# Patient Record
Sex: Female | Born: 2000 | Race: Black or African American | Hispanic: No | Marital: Single | Smoking: Never smoker
Health system: Southern US, Community
[De-identification: ages and names within clinical notes are randomized; demographics above are authoritative.]

## PROBLEM LIST (undated history)

## (undated) DIAGNOSIS — R7303 Prediabetes: Secondary | ICD-10-CM

## (undated) DIAGNOSIS — E119 Type 2 diabetes mellitus without complications: Secondary | ICD-10-CM

## (undated) HISTORY — DX: Prediabetes: R73.03

## (undated) HISTORY — DX: Type 2 diabetes mellitus without complications: E11.9

---
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
## 2021-01-15 ENCOUNTER — Ambulatory Visit (LOCAL_COMMUNITY_HEALTH_CENTER): Payer: Medicaid Other

## 2021-01-15 ENCOUNTER — Other Ambulatory Visit: Payer: Self-pay

## 2021-01-15 VITALS — BP 99/63 | Ht 64.5 in | Wt 137.0 lb

## 2021-01-15 DIAGNOSIS — Z3042 Encounter for surveillance of injectable contraceptive: Secondary | ICD-10-CM | POA: Diagnosis not present

## 2021-01-15 DIAGNOSIS — Z3009 Encounter for other general counseling and advice on contraception: Secondary | ICD-10-CM

## 2021-01-15 NOTE — Progress Notes (Signed)
11 weeks post depo. Voices no concerns. Depo administered today per order by E. Sciora,CNM dated 10/08/2020. Tolerated well L deltoid. Depo consent signed today. Next depo due 04/02/2021, pt has reminder. Jerel Shepherd, RN

## 2021-02-16 DIAGNOSIS — S01331A Puncture wound without foreign body of right ear, initial encounter: Secondary | ICD-10-CM | POA: Diagnosis not present

## 2021-02-16 DIAGNOSIS — S01332A Puncture wound without foreign body of left ear, initial encounter: Secondary | ICD-10-CM | POA: Diagnosis not present

## 2021-02-16 IMAGING — CT CT ABD-PELV W/ CM
2 of 4 series · 15 of 46 positions shown, 17 images · IV contrast (APPLIED)
Comparison: None.

CLINICAL DATA: Perirectal pain.  Abscess on the right.

EXAM:
CT ABDOMEN AND PELVIS WITH CONTRAST
TECHNIQUE: Multidetector CT imaging of the abdomen and pelvis was performed
using the standard protocol following bolus administration of
intravenous contrast.
CONTRAST:  75 mL OMNIPAQUE IOHEXOL 300 MG/ML  SOLN

[Series 2: routine abd/pel with · axial · 0.60mm/px · z∈[-1088,-693]mm · 12 of 95 slices shown, 14 images]
[im 8/95  soft-tissue]
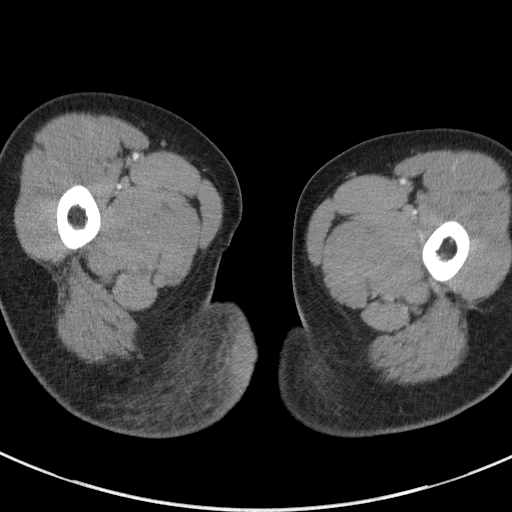
[im 8/95  bone]
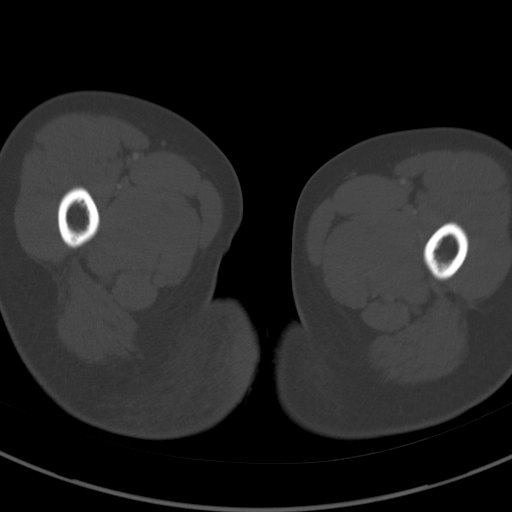
[im 15/95  soft-tissue]
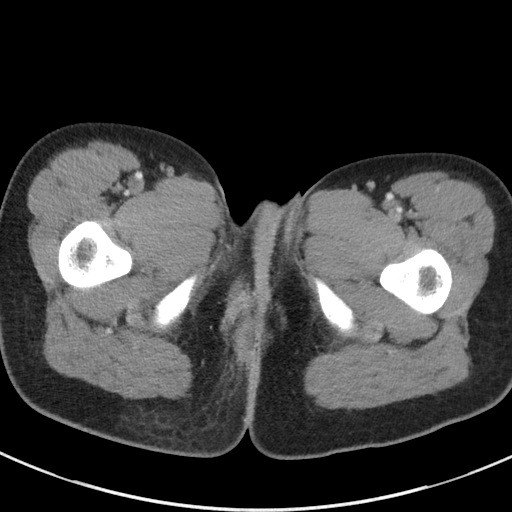
[im 22/95  soft-tissue]
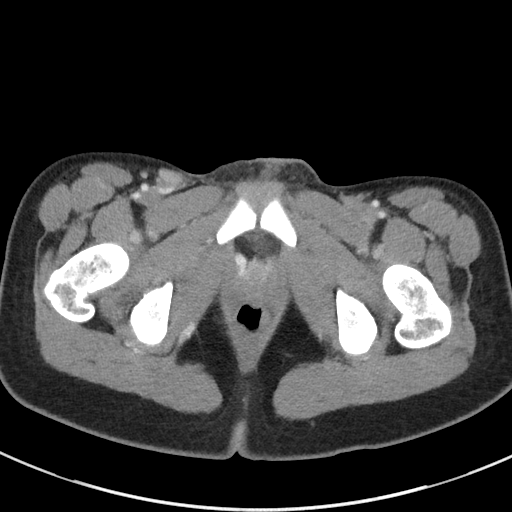
[im 29/95  soft-tissue]
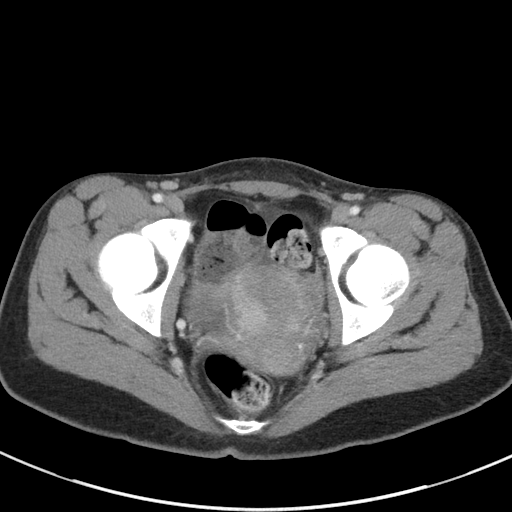
[im 37/95  soft-tissue]
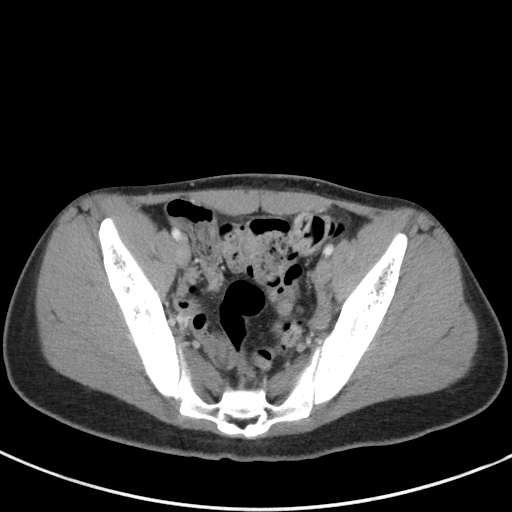
[im 44/95  soft-tissue]
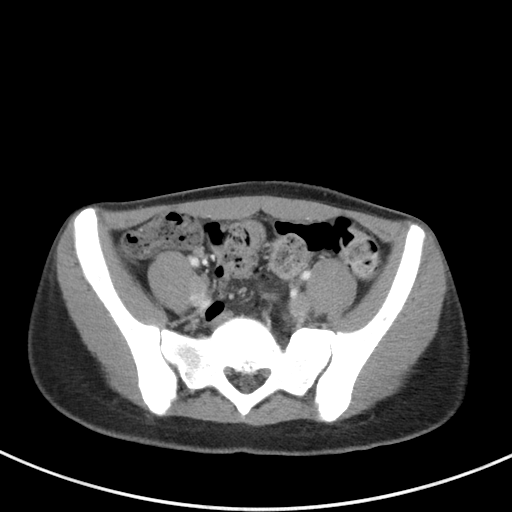
[im 51/95  soft-tissue]
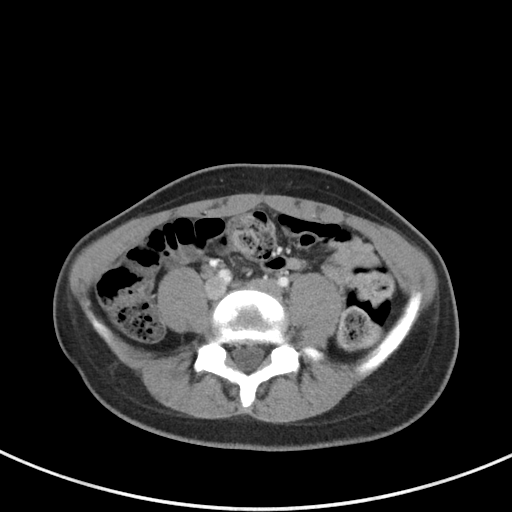
[im 58/95  soft-tissue]
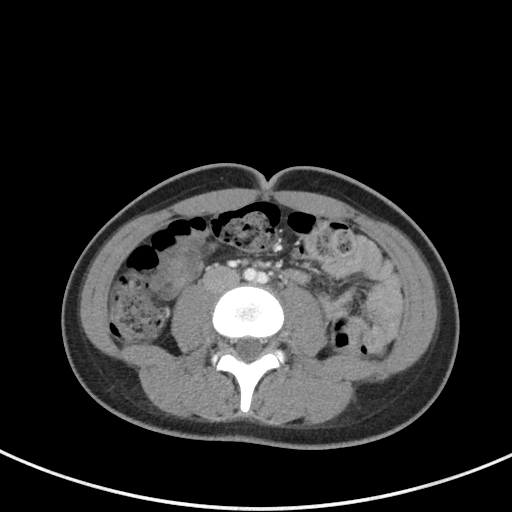
[im 66/95  soft-tissue]
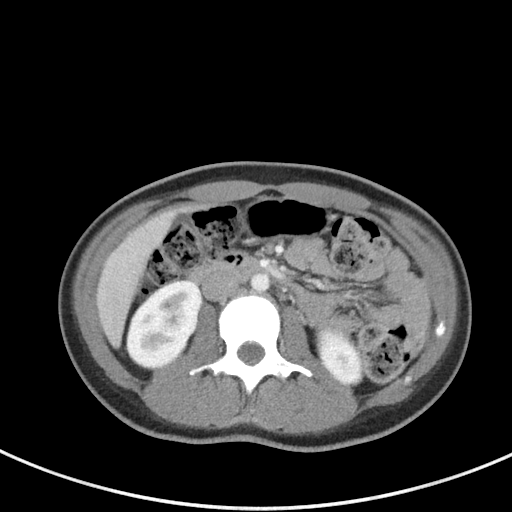
[im 66/95  bone]
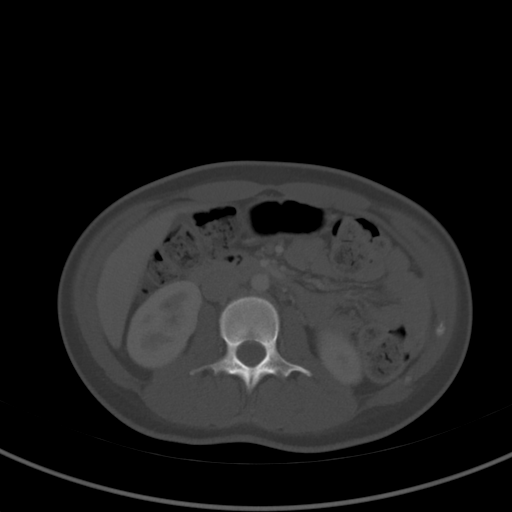
[im 73/95  soft-tissue]
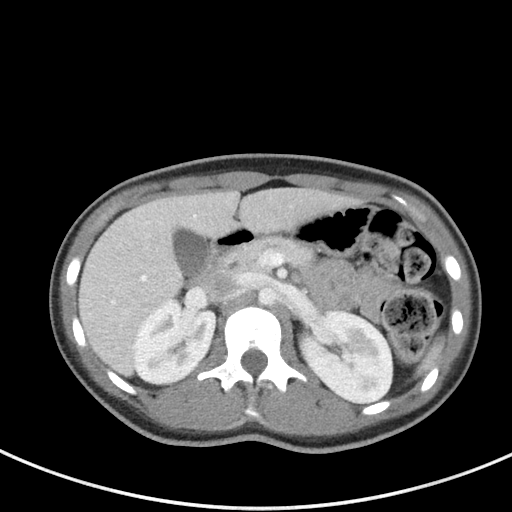
[im 80/95  soft-tissue]
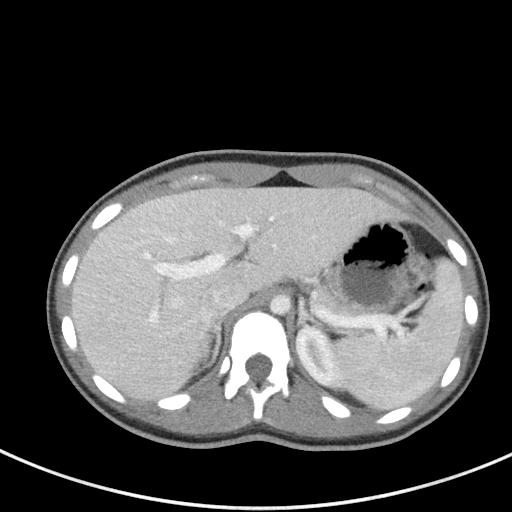
[im 87/95  soft-tissue]
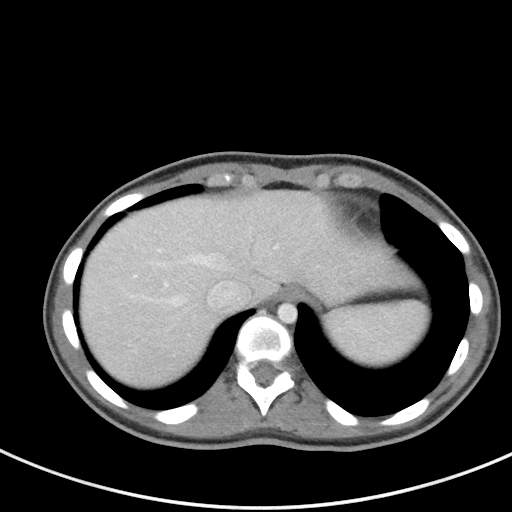

[Series 5: coronal st · coronal · 0.67mm/px · 3 of 62 slices shown]
[im 21/62  soft-tissue]
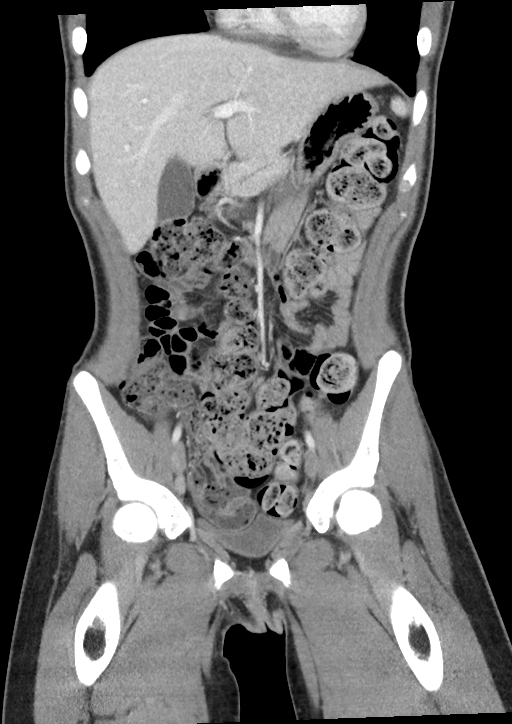
[im 28/62  soft-tissue]
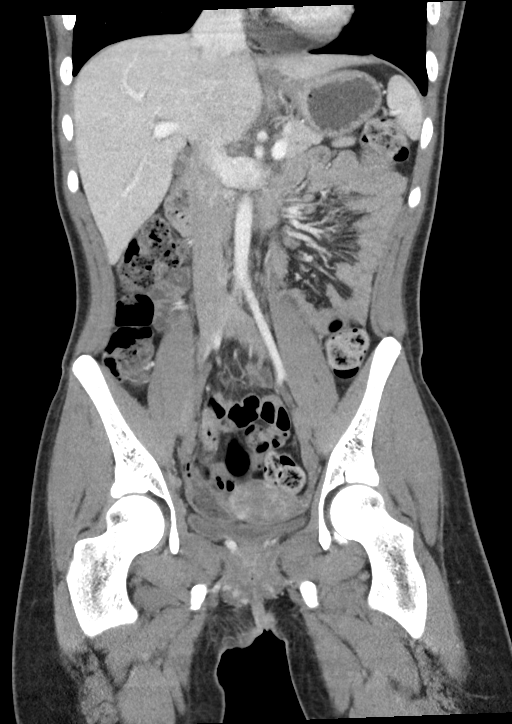
[im 34/62  soft-tissue]
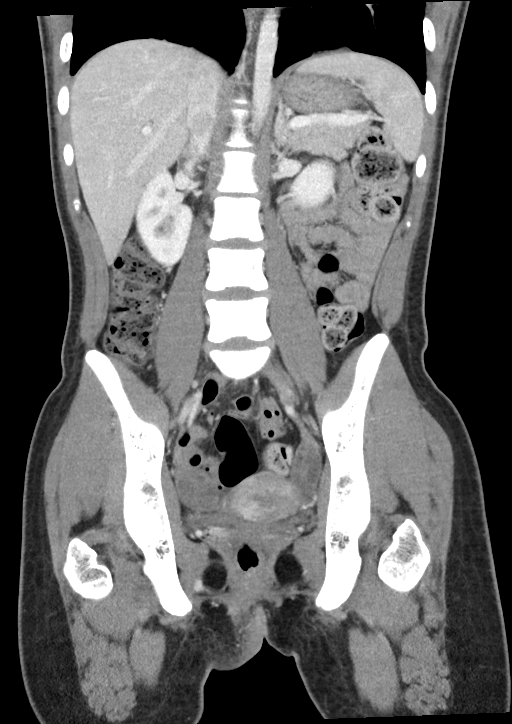

[15 of 46 positions shown; findings below may reference images not displayed]

FINDINGS: Lower chest: Lung bases clear.  No pleural or pericardial effusion.

Hepatobiliary: No focal liver abnormality is seen. No gallstones,
gallbladder wall thickening, or biliary dilatation.

Pancreas: Unremarkable. No pancreatic ductal dilatation or
surrounding inflammatory changes.

Spleen: Normal in size without focal abnormality.

Adrenals/Urinary Tract: Adrenal glands are unremarkable. Kidneys are
normal, without renal calculi, focal lesion, or hydronephrosis.
Bladder is unremarkable.

Stomach/Bowel: Stomach is within normal limits. Appendix appears
normal. No evidence of bowel wall thickening, distention, or
inflammatory changes.

Vascular/Lymphatic: No significant vascular findings are present. No
enlarged abdominal or pelvic lymph nodes.

Reproductive: Uterus and bilateral adnexa are unremarkable.

Other: There is stranding in the subcutaneous tissues of the medial
right buttock. A fluid collection in the medial aspect of the right
buttock measuring 1.5 cm craniocaudal by 0.6 cm transverse by
approximately 4 cm AP is consistent with abscess. Small volume of
free pelvic fluid is noted. Small volume of free fluid in the pelvis
is slightly greater than typically seen in physiologic change.

Musculoskeletal: Negative.
IMPRESSION: Cellulitis in the medial aspect of the right buttock and upper leg
with an associated abscess in the superficial subcutaneous fat as
described above.

Small volume of free pelvic fluid is slightly greater than typically
seen in physiologic change but of doubtful clinical significance.

## 2021-03-23 DIAGNOSIS — S00451A Superficial foreign body of right ear, initial encounter: Secondary | ICD-10-CM | POA: Diagnosis not present

## 2021-04-10 ENCOUNTER — Ambulatory Visit (LOCAL_COMMUNITY_HEALTH_CENTER): Payer: Medicaid Other

## 2021-04-10 ENCOUNTER — Other Ambulatory Visit: Payer: Self-pay

## 2021-04-10 VITALS — BP 113/69 | Ht 64.5 in | Wt 138.0 lb

## 2021-04-10 DIAGNOSIS — Z3042 Encounter for surveillance of injectable contraceptive: Secondary | ICD-10-CM | POA: Diagnosis not present

## 2021-04-10 DIAGNOSIS — Z3009 Encounter for other general counseling and advice on contraception: Secondary | ICD-10-CM | POA: Diagnosis not present

## 2021-04-10 NOTE — Progress Notes (Signed)
DMPA 10 mg given IM  Right Deltoid) today per Arnetha Courser, CNM order 10/08/2020 and tolerated well.  Patient is 12 w 1 d post last Depo. Hart Carwin, RN

## 2021-06-10 ENCOUNTER — Other Ambulatory Visit: Payer: Self-pay

## 2021-06-10 ENCOUNTER — Emergency Department
Admission: EM | Admit: 2021-06-10 | Discharge: 2021-06-10 | Disposition: A | Discharge status: Home / Self Care | Payer: Medicaid Other | Attending: Emergency Medicine | Admitting: Emergency Medicine

## 2021-06-10 DIAGNOSIS — J101 Influenza due to other identified influenza virus with other respiratory manifestations: Secondary | ICD-10-CM | POA: Diagnosis not present

## 2021-06-10 DIAGNOSIS — R059 Cough, unspecified: Secondary | ICD-10-CM | POA: Diagnosis present

## 2021-06-10 DIAGNOSIS — Z20822 Contact with and (suspected) exposure to covid-19: Secondary | ICD-10-CM | POA: Diagnosis not present

## 2021-06-10 DIAGNOSIS — R000 Tachycardia, unspecified: Secondary | ICD-10-CM | POA: Diagnosis not present

## 2021-06-10 LAB — RESP PANEL BY RT-PCR (FLU A&B, COVID) ARPGX2
Influenza A by PCR: POSITIVE — AB
Influenza B by PCR: NEGATIVE
SARS Coronavirus 2 by RT PCR: NEGATIVE

## 2021-06-10 MED ORDER — HYDROCOD POLST-CPM POLST ER 10-8 MG/5ML PO SUER: 5.0000 mL | Freq: Two times a day (BID) | ORAL | 0 refills | Status: AC

## 2021-06-10 MED ORDER — ONDANSETRON 4 MG PO TBDP: 4.0000 mg | ORAL_TABLET | Freq: Three times a day (TID) | ORAL | 0 refills | Status: AC | PRN

## 2021-06-10 MED ORDER — ONDANSETRON 4 MG PO TBDP
4.0000 mg | ORAL_TABLET | Freq: Once | ORAL | Status: AC
  Administered 2021-06-10: 06:00:00 4 mg via ORAL
  Filled 2021-06-10: qty 1

## 2021-06-10 MED ORDER — HYDROCOD POLST-CPM POLST ER 10-8 MG/5ML PO SUER
5.0000 mL | Freq: Once | ORAL | Status: AC
  Administered 2021-06-10: 5 mL via ORAL
  Filled 2021-06-10: qty 5

## 2021-06-10 NOTE — Discharge Instructions (Signed)
1.  You may take Zofran as needed for nausea. 2.  You may take Tussionex as needed for cough. 3.  Drink plenty of fluids daily. 4.  Return to the ER for worsening symptoms, persistent vomiting, difficulty breathing or other concerns.

## 2021-06-10 NOTE — ED Provider Notes (Signed)
[facility] Emergency Department Provider Note   ____________________________________________   Event Date/Time   First MD Initiated Contact with Patient 06/10/21 [phone]     (approximate)  I have reviewed the triage vital signs and the nursing notes.   HISTORY  Chief Complaint flu like symptoms    HPI Morgan Reyes is a 20 y.o. female who presents to the ED from home with a chief complaint of cough, congestion, fever, body aches and nausea x5 days.  Had been around someone with flu.  Denies chest pain, shortness of breath, abdominal pain, vomiting or diarrhea.      Past Medical History:  Diagnosis Date   Borderline diabetic    Diabetes mellitus without complication (HCC)    Borderline    Patient Active Problem List   Diagnosis Date Noted   Gluteal abscess 02/21/2019    Past Surgical History:  Procedure Laterality Date   INCISION AND DRAINAGE ABSCESS Right 02/21/2019   Procedure: INCISION AND DRAINAGE ABSCESS RIGHT GLUTEAL ABSCESS;  Surgeon: Miasia Crabtree Amabile, DO;  Location: [facility] ORS;  Service: General;  Laterality: Right;    Prior to Admission medications   Medication Sig Start Date End Date Taking? Authorizing Provider  chlorpheniramine-HYDROcodone (TUSSIONEX PENNKINETIC ER) 10-8 MG/5ML SUER Take 5 mLs by mouth 2 (two) times daily. 06/10/21  Yes Irean Hong, MD  ondansetron (ZOFRAN-ODT) 4 MG disintegrating tablet Take 1 tablet (4 mg total) by mouth every 8 (eight) hours as needed for nausea or vomiting. 06/10/21  Yes Irean Hong, MD  cetirizine (ZYRTEC) 10 MG tablet Take 10 mg by mouth daily as needed for allergies.    [provider]  DIFFERIN 0.1 % cream Apply 1 application topically Nightly. 12/27/18   [provider]  EPINEPHrine 0.3 mg/0.3 mL IJ SOAJ injection Inject 0.3 mLs into the muscle as needed. Patient not taking: No sig reported 10/28/17   [provider]    Allergies Pecan extract allergy skin  test  Family History  Problem Relation Age of Onset   Hypertension Mother    Heart disease Paternal Grandfather    Cancer Maternal Grandmother    Cancer Maternal Grandfather    Healthy Father     Social History Social History   Tobacco Use   Smoking status: Never   Smokeless tobacco: Never  Vaping Use   Vaping Use: Never used  Substance Use Topics   Alcohol use: Not Currently    Alcohol/week: 1.0 standard drink    Types: 1 Standard drinks or equivalent per week    Comment: last use 11/2019   Drug use: Not Currently    Review of Systems  Constitutional: Positive for fever body aches Eyes: No visual changes. ENT: No sore throat. Cardiovascular: Denies chest pain. Respiratory: Positive for cough denies shortness of breath. Gastrointestinal: No abdominal pain.  Positive for nausea, no vomiting.  No diarrhea.  No constipation. Genitourinary: Negative for dysuria. Musculoskeletal: Negative for back pain. Skin: Negative for rash. Neurological: Negative for headaches, focal weakness or numbness.   ____________________________________________   PHYSICAL EXAM:  VITAL SIGNS: ED Triage Vitals  Enc Vitals Group     BP 06/10/21 0234 132/69     Pulse Rate 06/10/21 0234 (!) 114     Resp 06/10/21 0234 20     Temp 06/10/21 0234 98.7 F (37.1 C)     Temp Source 06/10/21 0234 Oral     SpO2 06/10/21 0234 97 %     Weight 06/10/21 0231 120  lb (54.4 kg)     Height 06/10/21 0231 5\' 4"  (1.626 m)     Head Circumference --      Peak Flow --      Pain Score 06/10/21 0230 7     Pain Loc --      Pain Edu? --      Excl. in GC? --     Constitutional: Alert and oriented. Well appearing and in no acute distress. Eyes: Conjunctivae are normal. PERRL. EOMI. Head: Atraumatic. Nose: Congestion/rhinnorhea. Mouth/Throat: Mucous membranes are moist.  Oropharynx non-erythematous. Neck: No stridor.  Supple neck without meningismus. Cardiovascular: Normal rate, regular rhythm. Grossly  normal heart sounds.  Good peripheral circulation. Respiratory: Dry cough noted. Normal respiratory effort.  No retractions. Lungs CTAB. Gastrointestinal: Soft and nontender. No distention. No abdominal bruits. No CVA tenderness. Musculoskeletal: No lower extremity tenderness nor edema.  No joint effusions. Neurologic:  Normal speech and language. No gross focal neurologic deficits are appreciated. No gait instability. Skin:  Skin is warm, dry and intact. No rash noted.  No petechiae. Psychiatric: Mood and affect are normal. Speech and behavior are normal.  ____________________________________________   LABS (all labs ordered are listed, but only abnormal results are displayed)  Labs Reviewed  RESP PANEL BY RT-PCR (FLU A&B, COVID) ARPGX2 - Abnormal; Notable for the following components:      Result Value   Influenza A by PCR POSITIVE (*)    All other components within normal limits   ____________________________________________  EKG  ED ECG REPORT I, Mikhai Bienvenue J, the attending physician, personally viewed and interpreted this ECG.   Date: 06/10/2021  EKG Time: 0232  Rate: 122  Rhythm: sinus tachycardia  Axis: Normal  Intervals:none  ST&T Change: Nonspecific  ____________________________________________  RADIOLOGY I, Mercedees Convery J, personally viewed and evaluated these images (plain radiographs) as part of my medical decision making, as well as reviewing the written report by the radiologist.  ED MD interpretation: None  Official radiology report(s): No results found.  ____________________________________________   PROCEDURES  Procedure(s) performed (including Critical Care):  Procedures   ____________________________________________   INITIAL IMPRESSION / ASSESSMENT AND PLAN / ED COURSE  As part of my medical decision making, I reviewed the following data within the electronic MEDICAL RECORD NUMBER Nursing notes reviewed and incorporated, Labs reviewed, and Notes  from prior ED visits     20 year old female presenting with cough, congestion, body aches and nausea.  She is positive for influenza A.  She is out of the window for Tamiflu.  Will treat supportively with Tussionex, Zofran as needed.  Strict return precautions given.  Patient verbalizes understanding and agrees with plan of care.      ____________________________________________   FINAL CLINICAL IMPRESSION(S) / ED DIAGNOSES  Final diagnoses:  Influenza A     ED Discharge Orders          Ordered    chlorpheniramine-HYDROcodone (TUSSIONEX PENNKINETIC ER) 10-8 MG/5ML SUER  2 times daily        06/10/21 0521    ondansetron (ZOFRAN-ODT) 4 MG disintegrating tablet  Every 8 hours PRN        06/10/21 0521             Note:  This document was prepared using Dragon voice recognition software and may include unintentional dictation errors.    06/12/21, MD 06/10/21 [phone]

## 2021-06-10 NOTE — ED Triage Notes (Signed)
Pt states that she has had cough congestion fever since Friday afternoon. Pt states that she has been around someone who has the flu

## 2021-06-11 ENCOUNTER — Telehealth: Payer: Self-pay

## 2021-06-11 NOTE — Telephone Encounter (Signed)
Transition Care Management Unsuccessful Follow-up Telephone Call  Date of discharge and from where:  06/10/2021-[facility]  Attempts:  1st Attempt  Reason for unsuccessful TCM follow-up call:  Left voice message

## 2021-06-12 NOTE — Telephone Encounter (Signed)
Transition Care Management Unsuccessful Follow-up Telephone Call  Date of discharge and from where:  06/10/2021 from [facility]  Attempts:  2nd Attempt  Reason for unsuccessful TCM follow-up call:  Left voice message

## 2021-06-13 NOTE — Telephone Encounter (Signed)
Transition Care Management Unsuccessful Follow-up Telephone Call  Date of discharge and from where:  06/10/2021-[facility]  Attempts:  3rd Attempt  Reason for unsuccessful TCM follow-up call:  Left voice message

## 2021-06-26 ENCOUNTER — Ambulatory Visit (LOCAL_COMMUNITY_HEALTH_CENTER): Payer: Medicaid Other

## 2021-06-26 ENCOUNTER — Other Ambulatory Visit: Payer: Self-pay

## 2021-06-26 VITALS — BP 111/62 | Ht 64.5 in | Wt 137.5 lb

## 2021-06-26 DIAGNOSIS — Z3009 Encounter for other general counseling and advice on contraception: Secondary | ICD-10-CM | POA: Diagnosis not present

## 2021-06-26 DIAGNOSIS — Z3042 Encounter for surveillance of injectable contraceptive: Secondary | ICD-10-CM | POA: Diagnosis not present

## 2021-06-26 NOTE — Progress Notes (Signed)
11 weeks 0 days post depo. Voices no concerns. Depo given per order by Hazle Coca, CNM dated 10/08/2020. Tolerated well L delt. Next depo due 09/11/2021, has reminder. Jerel Shepherd, RN

## 2021-09-11 ENCOUNTER — Other Ambulatory Visit: Payer: Self-pay

## 2021-09-11 ENCOUNTER — Ambulatory Visit (LOCAL_COMMUNITY_HEALTH_CENTER): Payer: Medicaid Other

## 2021-09-11 VITALS — BP 120/69 | Ht 64.5 in | Wt 140.0 lb

## 2021-09-11 DIAGNOSIS — Z3042 Encounter for surveillance of injectable contraceptive: Secondary | ICD-10-CM | POA: Diagnosis not present

## 2021-09-11 DIAGNOSIS — Z3009 Encounter for other general counseling and advice on contraception: Secondary | ICD-10-CM

## 2021-09-11 DIAGNOSIS — Z309 Encounter for contraceptive management, unspecified: Secondary | ICD-10-CM | POA: Diagnosis not present

## 2021-09-11 MED ORDER — MEDROXYPROGESTERONE ACETATE 150 MG/ML IM SUSP: 150.0000 mg | Freq: Once | INTRAMUSCULAR | Status: AC

## 2021-09-11 NOTE — Progress Notes (Signed)
11 weeks 0 days post depo. Voices no concerns. Depo given today per order by Hazle Coca, CNM dated 10/08/2020. Tolerated well L delt (arm preference per pt). Return PE due 10/09/2021 and next depo due 11/27/2021, has reminders. Jerel Shepherd, RN ? ?

## 2021-12-01 ENCOUNTER — Ambulatory Visit (LOCAL_COMMUNITY_HEALTH_CENTER): Payer: Medicaid Other | Admitting: Nurse Practitioner

## 2021-12-01 ENCOUNTER — Encounter: Payer: Self-pay | Admitting: Nurse Practitioner

## 2021-12-01 VITALS — BP 119/60 | Ht 64.5 in | Wt 148.0 lb

## 2021-12-01 DIAGNOSIS — Z3042 Encounter for surveillance of injectable contraceptive: Secondary | ICD-10-CM

## 2021-12-01 DIAGNOSIS — Z309 Encounter for contraceptive management, unspecified: Secondary | ICD-10-CM | POA: Diagnosis not present

## 2021-12-01 DIAGNOSIS — Z0000 Encounter for general adult medical examination without abnormal findings: Secondary | ICD-10-CM

## 2021-12-01 DIAGNOSIS — Z3009 Encounter for other general counseling and advice on contraception: Secondary | ICD-10-CM

## 2021-12-01 MED ORDER — MEDROXYPROGESTERONE ACETATE 150 MG/ML IM SUSP
150.0000 mg | INTRAMUSCULAR | Status: AC
  Administered 2021-12-01 – 2022-10-28 (×5): 150 mg via INTRAMUSCULAR

## 2021-12-01 NOTE — Progress Notes (Signed)
[facility] [place] Number: [phone]    Family Planning Visit- Initial Visit  Subjective:  Morgan Reyes is a 21 y.o.  G0P0000   being seen today for an initial annual visit and to discuss reproductive life planning.  The patient is currently using Hormonal Injection for pregnancy prevention. Patient reports   does not want a pregnancy in the next year.     report they are looking for a method that provides High efficacy at preventing pregnancy and Methods that does not involve too much memory  Patient has the following medical conditions has Gluteal abscess on their problem list.  Chief Complaint  Patient presents with   Annual Exam    Annual physical and continuation of Depo    Patient reports to clinic today for a physical.    Patient denies signs and symptoms    Body mass index is 25.01 kg/m. - Patient is eligible for diabetes screening based on BMI and age [id number]?  not applicable [id number] ordered? not applicable  Patient reports 1  partner/s in last year. Desires STI screening?  No - refused  Has patient been screened once for HCV in the past?  No  No results found for: "HCVAB"  Does the patient have current drug use (including MJ), have a partner with drug use, and/or has been incarcerated since last result? No  If yes-- Screen for HCV through [facility]   Does the patient meet criteria for HBV testing? No  Criteria:  -Household, sexual or needle sharing contact with HBV -History of drug use -HIV positive -Those with known Hep C   Health Maintenance Due  Topic Date Due   COVID-19 Vaccine (1) Never done   CHLAMYDIA SCREENING  Never done   HIV Screening  Never done   Hepatitis C Screening  Never done    Review of Systems  Constitutional:  Negative for chills, fever, malaise/fatigue and weight loss.  HENT:  Negative for congestion, hearing loss and sore throat.   Eyes:  Negative for  blurred vision, double vision and photophobia.  Respiratory:  Negative for shortness of breath.   Cardiovascular:  Negative for chest pain.       Left calf pain  Gastrointestinal:  Negative for abdominal pain, blood in stool, constipation, diarrhea, heartburn, nausea and vomiting.  Genitourinary:  Negative for dysuria and frequency.  Musculoskeletal:  Negative for back pain, joint pain and neck pain.  Skin:  Negative for itching and rash.  Neurological:  Negative for dizziness, weakness and headaches.  Endo/Heme/Allergies:  Does not bruise/bleed easily.  Psychiatric/Behavioral:  Negative for depression, substance abuse and suicidal ideas.     The following portions of the patient's history were reviewed and updated as appropriate: allergies, current medications, past family history, past medical history, past social history, past surgical history and problem list. Problem list updated.   See flowsheet for other program required questions.  Objective:   Vitals:   12/01/21 1451  BP: 119/60  Weight: 148 lb (67.1 kg)  Height: 5' 4.5" (1.638 m)    Physical Exam Constitutional:      Appearance: Normal appearance.  HENT:     Head: Normocephalic.     Right Ear: External ear normal.     Left Ear: External ear normal.     Nose: Nose normal.     Mouth/Throat:     Lips: Pink.     Mouth: Mucous membranes are moist.  Comments: No visible signs of dental caries  Cardiovascular:     Rate and Rhythm: Normal rate and regular rhythm.  Pulmonary:     Effort: Pulmonary effort is normal.     Breath sounds: Normal breath sounds.  Chest:     Comments: Breasts:        Right: Normal. No swelling, mass, nipple discharge, skin change or tenderness.        Left: Normal. No swelling, mass, nipple discharge, skin change or tenderness.   Abdominal:     General: Abdomen is flat. Bowel sounds are normal.     Palpations: Abdomen is soft.  Genitourinary:    Comments: Deferred, declines genital  exam. Musculoskeletal:     Cervical back: Full passive range of motion without pain, normal range of motion and neck supple.  Skin:    General: Skin is warm and dry.  Neurological:     Mental Status: She is alert and oriented to person, place, and time.  Psychiatric:        Attention and Perception: Attention normal.        Mood and Affect: Mood normal.        Speech: Speech normal.        Behavior: Behavior normal. Behavior is cooperative.       Assessment and Plan:  Morgan Reyes is a 21 y.o. female presenting to the [facility] Department for an initial annual wellness/contraceptive visit  Contraception counseling: Reviewed options based on patient desire and reproductive life plan. Patient is interested in Hormonal Injection. This was provided to the patient today.  Risks, benefits, and typical effectiveness rates were reviewed.  Questions were answered.  Written information was also given to the patient to review.    The patient will follow up in  11 weeks for surveillance and Depo.  The patient was told to call with any further questions, or with any concerns about this method of contraception.  Emphasized use of condoms 100% of the time for STI prevention.  Need for ECP was assessed. ECP not offered due to continuous use of Depo.   1. Family planning counseling -21 year old female in clinic today for a physical and Depo. -ROS reviewed.  Patient complains of left calf pain.  No redness or swelling noted to left leg or pain.  Advised to stretch out calf muscle and apply warm and cold compresses to left leg. -Patient declines all STD screening today. -Desires to continue with Depo.   2. Well woman exam (no gynecological exam) -Normal well woman exam.  -CBE today, next due 11/2024. -PAP due at age 36.   27. Surveillance for Depo-Provera contraception -May have Depo 150 MG IM q 11-13 weeks x 1 year.    - medroxyPROGESTERone (DEPO-PROVERA) injection 150  mg     Return in about 11 weeks (around 02/16/2022) for Routine DMPA injection.    Gregary Cromer, FNP

## 2021-12-01 NOTE — Progress Notes (Signed)
Pt here for annual physical examination and continuation of Depo Provera for birth control.  No STI screening labs performed per pt choice.  Depo Provera 150mg  IM given in right deltoid without problems.  Condoms declined-Ketrina Boateng, RN

## 2022-02-16 ENCOUNTER — Ambulatory Visit (LOCAL_COMMUNITY_HEALTH_CENTER): Payer: Medicaid Other

## 2022-02-16 VITALS — BP 123/86 | Ht 64.5 in | Wt 149.5 lb

## 2022-02-16 DIAGNOSIS — Z3042 Encounter for surveillance of injectable contraceptive: Secondary | ICD-10-CM | POA: Diagnosis not present

## 2022-02-16 DIAGNOSIS — Z3009 Encounter for other general counseling and advice on contraception: Secondary | ICD-10-CM

## 2022-02-16 DIAGNOSIS — Z309 Encounter for contraceptive management, unspecified: Secondary | ICD-10-CM

## 2022-02-16 NOTE — Progress Notes (Signed)
Patient seen in nurse clinic 11 weeks post hormonal injection.  Medroxyprogesterone Acetate 150 mg administered in left deltoid per order by Glenna Fellows, FNP, dated 12/01/2021 for 1 year.  Tolerated well.  Voices no concerns with hormonal injections. Next injection due 05/04/2022 and reminder card provided.

## 2022-05-12 ENCOUNTER — Ambulatory Visit (LOCAL_COMMUNITY_HEALTH_CENTER): Payer: Medicaid Other

## 2022-05-12 VITALS — BP 115/64 | Ht 64.0 in | Wt 146.5 lb

## 2022-05-12 DIAGNOSIS — Z3042 Encounter for surveillance of injectable contraceptive: Secondary | ICD-10-CM | POA: Diagnosis not present

## 2022-05-12 DIAGNOSIS — Z309 Encounter for contraceptive management, unspecified: Secondary | ICD-10-CM | POA: Diagnosis not present

## 2022-05-12 DIAGNOSIS — Z3009 Encounter for other general counseling and advice on contraception: Secondary | ICD-10-CM

## 2022-05-12 NOTE — Progress Notes (Signed)
12 weeks 1 day post depo.  Denies any problems.  Depo given IM right deltoid per order by A. White FNP dated 12/01/21; tolerated well. Next depo due 07/28/22; appt reminder given.   Cherlynn Polo, RN

## 2022-08-12 ENCOUNTER — Ambulatory Visit (LOCAL_COMMUNITY_HEALTH_CENTER): Payer: Medicaid Other

## 2022-08-12 VITALS — BP 112/63 | Ht 64.0 in | Wt 145.5 lb

## 2022-08-12 DIAGNOSIS — Z30013 Encounter for initial prescription of injectable contraceptive: Secondary | ICD-10-CM

## 2022-08-12 DIAGNOSIS — Z3009 Encounter for other general counseling and advice on contraception: Secondary | ICD-10-CM

## 2022-08-12 DIAGNOSIS — Z308 Encounter for other contraceptive management: Secondary | ICD-10-CM | POA: Diagnosis not present

## 2022-08-12 DIAGNOSIS — Z3042 Encounter for surveillance of injectable contraceptive: Secondary | ICD-10-CM

## 2022-08-12 NOTE — Progress Notes (Signed)
13weeks 1days post depo. Voices no concerns. Depo given today per order by A. White, FNP dated 12/01/2021. Tolerated well R. Delt. Next due 10/28/22.

## 2022-10-28 ENCOUNTER — Ambulatory Visit (LOCAL_COMMUNITY_HEALTH_CENTER): Payer: Medicaid Other

## 2022-10-28 VITALS — BP 104/68 | Ht 64.0 in | Wt 146.0 lb

## 2022-10-28 DIAGNOSIS — Z3042 Encounter for surveillance of injectable contraceptive: Secondary | ICD-10-CM

## 2022-10-28 DIAGNOSIS — Z309 Encounter for contraceptive management, unspecified: Secondary | ICD-10-CM

## 2022-10-28 DIAGNOSIS — Z3009 Encounter for other general counseling and advice on contraception: Secondary | ICD-10-CM

## 2022-10-28 NOTE — Progress Notes (Signed)
11 weeks 0 days post depo. Voices no concerns. Depo given today per order by Onalee Hua, FNP dated 12/01/2021. Tolerated well L delt. Plans to get PE when next depo is due 01/13/2023 (approx). Jerel Shepherd, RN

## 2022-12-23 DIAGNOSIS — M222X2 Patellofemoral disorders, left knee: Secondary | ICD-10-CM | POA: Diagnosis not present

## 2023-01-13 ENCOUNTER — Ambulatory Visit: Payer: Medicaid Other

## 2023-01-13 VITALS — BP 118/70 | Ht 64.0 in | Wt 147.0 lb

## 2023-01-13 DIAGNOSIS — Z309 Encounter for contraceptive management, unspecified: Secondary | ICD-10-CM

## 2023-01-13 DIAGNOSIS — Z3042 Encounter for surveillance of injectable contraceptive: Secondary | ICD-10-CM

## 2023-01-13 DIAGNOSIS — Z3009 Encounter for other general counseling and advice on contraception: Secondary | ICD-10-CM

## 2023-01-13 MED ORDER — MEDROXYPROGESTERONE ACETATE 150 MG/ML IM SUSP
150.0000 mg | Freq: Once | INTRAMUSCULAR | Status: AC
  Administered 2023-01-13: 150 mg via INTRAMUSCULAR

## 2023-01-13 NOTE — Progress Notes (Signed)
11w 0d post depo. Voices no concerns. Depo given today per S.O. by K. Alvester Morin, MD. Tolerated well in R deltoid. Patient scheduled for PE 02/03/2023; has reminder card. Next depo due 03/31/2023.   Abagail Kitchens, RN

## 2023-01-14 ENCOUNTER — Ambulatory Visit: Payer: Medicaid Other

## 2023-01-14 DIAGNOSIS — Z309 Encounter for contraceptive management, unspecified: Secondary | ICD-10-CM | POA: Diagnosis not present

## 2023-01-14 DIAGNOSIS — Z30013 Encounter for initial prescription of injectable contraceptive: Secondary | ICD-10-CM | POA: Diagnosis not present

## 2023-01-28 DIAGNOSIS — H52213 Irregular astigmatism, bilateral: Secondary | ICD-10-CM | POA: Diagnosis not present

## 2023-02-03 ENCOUNTER — Ambulatory Visit: Payer: Medicaid Other

## 2023-04-12 ENCOUNTER — Ambulatory Visit: Payer: Medicaid Other

## 2023-04-19 ENCOUNTER — Ambulatory Visit: Payer: Medicaid Other | Admitting: Family Medicine

## 2023-04-19 ENCOUNTER — Encounter: Payer: Self-pay | Admitting: Family Medicine

## 2023-04-19 VITALS — BP 110/73 | HR 90 | Ht 64.0 in | Wt 142.2 lb

## 2023-04-19 DIAGNOSIS — Z01419 Encounter for gynecological examination (general) (routine) without abnormal findings: Secondary | ICD-10-CM | POA: Diagnosis not present

## 2023-04-19 DIAGNOSIS — Z309 Encounter for contraceptive management, unspecified: Secondary | ICD-10-CM | POA: Diagnosis not present

## 2023-04-19 DIAGNOSIS — R87612 Low grade squamous intraepithelial lesion on cytologic smear of cervix (LGSIL): Secondary | ICD-10-CM | POA: Diagnosis not present

## 2023-04-19 DIAGNOSIS — Z113 Encounter for screening for infections with a predominantly sexual mode of transmission: Secondary | ICD-10-CM

## 2023-04-19 DIAGNOSIS — Z1331 Encounter for screening for depression: Secondary | ICD-10-CM | POA: Insufficient documentation

## 2023-04-19 DIAGNOSIS — Z3009 Encounter for other general counseling and advice on contraception: Secondary | ICD-10-CM

## 2023-04-19 LAB — WET PREP FOR TRICH, YEAST, CLUE
Trichomonas Exam: NEGATIVE
Yeast Exam: NEGATIVE

## 2023-04-19 LAB — HM HIV SCREENING LAB: HM HIV Screening: NEGATIVE

## 2023-04-19 NOTE — Progress Notes (Signed)
[facility] DEPARTMENT [facility] [address] Main Number: [phone]  Family Planning Visit- Repeat Yearly Visit  Subjective:  Morgan Reyes is a 22 y.o. G0P0000  being seen today for an annual wellness visit and to discuss contraception options.   The patient is currently using Hormonal Injection for pregnancy prevention. Patient does not want a pregnancy in the next year.    report they are looking for a method that provides Ready when they are    Patient has the following medical problems: has Gluteal abscess on their problem list.  Chief Complaint  Patient presents with   Annual Exam    Physical    Patient reports to clinic for PE and pap smear.   See flowsheet for other program required questions.   Body mass index is 24.41 kg/m. - Patient is eligible for diabetes screening based on BMI> 25 and age >35?  no HA1C ordered? not applicable  Patient reports 3 of partners in last year. Desires STI screening?  Yes   Has patient been screened once for HCV in the past?  No  No results found for: "HCVAB"  Does the patient have current of drug use, have a partner with drug use, and/or has been incarcerated since last result? No  If yes-- Screen for HCV through [facility]   Does the patient meet criteria for HBV testing? No  Criteria:  -Household, sexual or needle sharing contact with HBV -History of drug use -HIV positive -Those with known Hep C   Health Maintenance Due  Topic Date Due   CHLAMYDIA SCREENING  Never done   HIV Screening  Never done   Hepatitis C Screening  Never done   Cervical Cancer Screening (Pap smear)  Never done   INFLUENZA VACCINE  01/21/2023   DTaP/Tdap/Td (2 - Td or Tdap) 02/18/2023   COVID-19 Vaccine (1 - 2023-24 season) Never done    Review of Systems  Constitutional:  Negative for weight loss.  Eyes:  Negative for blurred vision.  Respiratory:  Negative for cough and shortness of breath.    Cardiovascular:  Negative for claudication.  Gastrointestinal:  Negative for nausea.  Genitourinary:  Negative for dysuria and frequency.  Skin:  Negative for rash.  Neurological:  Positive for headaches.  Endo/Heme/Allergies:  Does not bruise/bleed easily.  Psychiatric/Behavioral:  Positive for depression. Negative for suicidal ideas.     The following portions of the patient's history were reviewed and updated as appropriate: allergies, current medications, past family history, past medical history, past social history, past surgical history and problem list. Problem list updated.  Objective:   Vitals:   04/19/23 1319  BP: 110/73  Pulse: 90  Weight: 142 lb 3.2 oz (64.5 kg)  Height: 5\' 4"  (1.626 m)    Physical Exam Vitals and nursing note reviewed. Exam conducted with a chaperone present Justice Rocher RN).  Constitutional:      Appearance: Normal appearance.  HENT:     Head: Normocephalic and atraumatic.     Mouth/Throat:     Mouth: Mucous membranes are moist.     Pharynx: Oropharynx is clear. No oropharyngeal exudate or posterior oropharyngeal erythema.  Pulmonary:     Effort: Pulmonary effort is normal.  Abdominal:     General: Abdomen is flat.     Palpations: There is no mass.     Tenderness: There is no abdominal tenderness. There is no rebound.  Genitourinary:    General: Normal vulva.  Exam position: Lithotomy position.     Pubic Area: No rash or pubic lice.      Labia:        Right: No rash or lesion.        Left: No rash or lesion.      Vagina: Bleeding present. No vaginal discharge, erythema or lesions.     Cervix: No cervical motion tenderness, discharge, friability, lesion or erythema.     Uterus: Normal.      Adnexa: Right adnexa normal and left adnexa normal.     Rectum: Normal.     Comments: pH = 4 Lymphadenopathy:     Head:     Right side of head: No preauricular or posterior auricular adenopathy.     Left side of head: No preauricular or  posterior auricular adenopathy.     Cervical: No cervical adenopathy.     Upper Body:     Right upper body: No supraclavicular, axillary or epitrochlear adenopathy.     Left upper body: No supraclavicular, axillary or epitrochlear adenopathy.     Lower Body: No right inguinal adenopathy. No left inguinal adenopathy.  Skin:    General: Skin is warm and dry.     Findings: No rash.  Neurological:     Mental Status: She is alert and oriented to person, place, and time.       Assessment and Plan:  Morgan Reyes is a 22 y.o. female G0P0000 presenting to the [facility] Department for an yearly wellness and contraception visit  1. Family planning Contraception counseling: Reviewed options based on patient desire and reproductive life plan. Patient is interested in Female Condom. This was provided to the patient today.  Risks, benefits, and typical effectiveness rates were reviewed.  Questions were answered.  Written information was also given to the patient to review.    The patient will follow up in  1 years for surveillance.  The patient was told to call with any further questions, or with any concerns about this method of contraception.  Emphasized use of condoms 100% of the time for STI prevention.  Educated on ECP and assessed need for ECP. Not indicated- covered under depo window  2. Screening for venereal disease  - HIV [place] [facility] - Chlamydia/Gonorrhea [place] [facility] - Syphilis Serology, [place] [facility] - WET PREP FOR TRICH, YEAST, CLUE  3. Well woman exam with routine gynecological exam CBE not indicated until 25 per ACOG guidelines -first pap today -reports new migraines and some blurry vision, has not been sleeping well -reports she got new prescription about 2 months ago- encouraged to go back to eye doctor for a check up -denies heavy caffeine use, reports drinking a red bull 1-2x/week -reviewed that sometimes depression and poor sleep can lead to other symptoms-  encourages appt with therapist   - IGP, rfx Aptima HPV ASCU  4. Positive depression screening PHQ-9 score of 18 Denies SI/HI -accepts referral to [facility] -reports she has felt this way for a long, since 22 yo -reports feeling down, difficulty sleeping, and "I don't want to be around anyone"     Return in about 3 months (around 07/20/2023) for depo injection.  No future appointments.  Lenice Llamas, [place]

## 2023-04-19 NOTE — Progress Notes (Signed)
Pt here for PE, pap smear and STD testing. Wet prep results reviewed, no treatment required per SO.  FP packet and condoms given to pt.  Berdie Ogren, RN

## 2023-04-28 ENCOUNTER — Encounter: Payer: Self-pay | Admitting: Family Medicine

## 2023-04-28 DIAGNOSIS — Z8742 Personal history of other diseases of the female genital tract: Secondary | ICD-10-CM | POA: Insufficient documentation

## 2023-04-28 LAB — IGP, RFX APTIMA HPV ASCU: PAP Smear Comment: 0

## 2023-08-10 DIAGNOSIS — H52213 Irregular astigmatism, bilateral: Secondary | ICD-10-CM | POA: Diagnosis not present

## 2023-08-13 DIAGNOSIS — H5213 Myopia, bilateral: Secondary | ICD-10-CM | POA: Diagnosis not present

## 2023-10-28 ENCOUNTER — Other Ambulatory Visit: Payer: Self-pay

## 2023-10-28 ENCOUNTER — Emergency Department
Admission: EM | Admit: 2023-10-28 | Discharge: 2023-10-28 | Disposition: A | Discharge status: Home / Self Care | Attending: Emergency Medicine | Admitting: Emergency Medicine

## 2023-10-28 DIAGNOSIS — R1084 Generalized abdominal pain: Secondary | ICD-10-CM | POA: Diagnosis present

## 2023-10-28 DIAGNOSIS — N939 Abnormal uterine and vaginal bleeding, unspecified: Secondary | ICD-10-CM | POA: Insufficient documentation

## 2023-10-28 LAB — URINALYSIS, ROUTINE W REFLEX MICROSCOPIC
Bacteria, UA: NONE SEEN
RBC / HPF: >50 RBC/hpf (ref 0–5)

## 2023-10-28 LAB — COMPREHENSIVE METABOLIC PANEL WITH GFR
ALT: 14 U/L (ref 0–44)
AST: 16 U/L (ref 15–41)
Albumin: 3.5 g/dL (ref 3.5–5.0)
Alkaline Phosphatase: 70 U/L (ref 38–126)
Anion gap: 8 (ref 5–15)
BUN: 6 mg/dL (ref 6–20)
CO2: 24 mmol/L (ref 22–32)
Calcium: 8.9 mg/dL (ref 8.9–10.3)
Chloride: 106 mmol/L (ref 98–111)
Creatinine, Ser: 0.84 mg/dL (ref 0.44–1.00)
GFR, Estimated: >60 mL/min (ref >60)
Glucose, Bld: 92 mg/dL (ref 70–99)
Potassium: 3.8 mmol/L (ref 3.5–5.1)
Sodium: 138 mmol/L (ref 135–145)
Total Bilirubin: 0.3 mg/dL (ref 0.0–1.2)
Total Protein: 7 g/dL (ref 6.5–8.1)

## 2023-10-28 LAB — CBC WITH DIFFERENTIAL/PLATELET
Abs Immature Granulocytes: 0.01 10*3/uL (ref 0.00–0.07)
Basophils Absolute: 0 10*3/uL (ref 0.0–0.1)
Basophils Relative: 1 %
Eosinophils Absolute: 0.2 10*3/uL (ref 0.0–0.5)
Eosinophils Relative: 3 %
HCT: 32.4 % — ABNORMAL LOW (ref 36.0–46.0)
Hemoglobin: 10.2 g/dL — ABNORMAL LOW (ref 12.0–15.0)
Immature Granulocytes: 0 %
Lymphocytes Relative: 39 %
Lymphs Abs: 2.3 10*3/uL (ref 0.7–4.0)
MCH: 28.7 pg (ref 26.0–34.0)
MCHC: 31.5 g/dL (ref 30.0–36.0)
MCV: 91 fL (ref 80.0–100.0)
Monocytes Absolute: 0.4 10*3/uL (ref 0.1–1.0)
Monocytes Relative: 7 %
Neutro Abs: 3.1 10*3/uL (ref 1.7–7.7)
Neutrophils Relative %: 50 %
Platelets: 346 10*3/uL (ref 150–400)
RBC: 3.56 MIL/uL — ABNORMAL LOW (ref 3.87–5.11)
RDW: 13.7 % (ref 11.5–15.5)
WBC: 6.1 10*3/uL (ref 4.0–10.5)
nRBC: 0 % (ref 0.0–0.2)

## 2023-10-28 LAB — PREGNANCY, URINE: Preg Test, Ur: NEGATIVE

## 2023-10-28 LAB — CBG MONITORING, ED: Glucose-Capillary: 93 mg/dL (ref 70–99)

## 2023-10-28 NOTE — ED Notes (Addendum)
 See triage note States she is having some abd cramping with vaginal bleeding  States the bleeding started about 1 month ago

## 2023-10-28 NOTE — Discharge Instructions (Addendum)
 You can take Midol  and ibuprofen  as needed for cramping.  The Midol  contains Tylenol  so do not take Tylenol  at the same time as taking the Midol .  Schedule follow-up appointment with OB/GYN.  Their information is attached.  Call to schedule.

## 2023-10-28 NOTE — ED Triage Notes (Signed)
 Patient states abdominal cramping and vaginal bleeding; LMP 10/07/22. States this doesn't feel like a normal menstrual cycle, but also states her cycles are irregular since stopping birth control in November.

## 2023-10-28 NOTE — ED Provider Notes (Signed)
 [facility] Provider Note    Event Date/Time   First MD Initiated Contact with Patient 10/28/23 [phone]     (approximate)   History   Vaginal Bleeding   HPI  Morgan Reyes is a 23 y.o. female with PMH of borderline diabetes who presents for evaluation of vaginal bleeding and abdominal cramping.  Patient states that her last menstrual period was 10/07/23.  Patient stopped the Depo shot in November and states she has had irregular periods since then.  She feels like this does not feel like her normal menstrual cycle.      Physical Exam   Triage Vital Signs: ED Triage Vitals  Encounter Vitals Group     BP 10/28/23 0806 115/88     Systolic BP Percentile --      Diastolic BP Percentile --      Pulse Rate 10/28/23 0806 68     Resp 10/28/23 0806 18     Temp 10/28/23 0806 98.1 F (36.7 C)     Temp Source 10/28/23 0806 Oral     SpO2 10/28/23 0806 100 %     Weight 10/28/23 0806 145 lb (65.8 kg)     Height 10/28/23 0806 5\' 4"  (1.626 m)     Head Circumference --      Peak Flow --      Pain Score 10/28/23 0808 8     Pain Loc --      Pain Education --      Exclude from Growth Chart --     Most recent vital signs: Vitals:   10/28/23 0806  BP: 115/88  Pulse: 68  Resp: 18  Temp: 98.1 F (36.7 C)  SpO2: 100%   General: Awake, no distress.  CV:  Good peripheral perfusion.  RRR. Resp:  Normal effort.  CTAB. Abd:  No distention.  Soft, generalized tenderness to palpation.   ED Results / Procedures / Treatments   Labs (all labs ordered are listed, but only abnormal results are displayed) Labs Reviewed  CBC WITH DIFFERENTIAL/PLATELET - Abnormal; Notable for the following components:      Result Value   RBC 3.56 (*)    Hemoglobin 10.2 (*)    HCT 32.4 (*)    All other components within normal limits  URINALYSIS, ROUTINE W REFLEX MICROSCOPIC - Abnormal; Notable for the following components:   Color, Urine RED (*)    APPearance HAZY (*)     Glucose, UA   (*)    Value: TEST NOT REPORTED DUE TO COLOR INTERFERENCE OF URINE PIGMENT   Hgb urine dipstick   (*)    Value: TEST NOT REPORTED DUE TO COLOR INTERFERENCE OF URINE PIGMENT   Bilirubin Urine   (*)    Value: TEST NOT REPORTED DUE TO COLOR INTERFERENCE OF URINE PIGMENT   Ketones, ur   (*)    Value: TEST NOT REPORTED DUE TO COLOR INTERFERENCE OF URINE PIGMENT   Protein, ur   (*)    Value: TEST NOT REPORTED DUE TO COLOR INTERFERENCE OF URINE PIGMENT   Nitrite   (*)    Value: TEST NOT REPORTED DUE TO COLOR INTERFERENCE OF URINE PIGMENT   Leukocytes,Ua   (*)    Value: TEST NOT REPORTED DUE TO COLOR INTERFERENCE OF URINE PIGMENT   All other components within normal limits  COMPREHENSIVE METABOLIC PANEL WITH GFR  PREGNANCY, URINE  CBG MONITORING, ED   PROCEDURES:  Critical Care performed: No  Procedures   MEDICATIONS  ORDERED IN ED: Medications - No data to display   IMPRESSION / MDM / ASSESSMENT AND PLAN / ED COURSE  I reviewed the triage vital signs and the nursing notes.                             23 year old female presents for evaluation of abdominal cramping and vaginal bleeding.  Vital signs are stable patient NAD on exam.  Differential diagnosis includes, but is not limited to, implantation bleeding, ectopic pregnancy, miscarriage, menorrhagia, uterine fibroids.  Patient's presentation is most consistent with acute complicated illness / injury requiring diagnostic workup.  CBC shows mild anemia.  CMP unremarkable.  CBG is normal.  Pregnancy test is negative.  Urinalysis difficult to interpret due to to the amount of blood in the urine.  Patient denies urinary symptoms and there is no bacteria seen.  Offered to do pelvic ultrasound to evaluate for uterine fibroids and ovarian cysts, patient declined.  Recommended she take Midol  and ibuprofen  for cramps.  Encouraged her to take an iron  supplement for the anemia. Will give her follow-up with OB/GYN.  I feel she is  stable for outpatient management.  Patient voiced understanding, all questions were answered and she was stable at discharge.     FINAL CLINICAL IMPRESSION(S) / ED DIAGNOSES   Final diagnoses:  Vaginal bleeding     Rx / DC Orders   ED Discharge Orders     None        Note:  This document was prepared using Dragon voice recognition software and may include unintentional dictation errors.   Phyliss Breen, PA-C 10/28/23 8295    Jacquie Maudlin, MD 10/28/23 [phone]

## 2023-12-31 ENCOUNTER — Telehealth: Admitting: Physician Assistant

## 2023-12-31 DIAGNOSIS — N760 Acute vaginitis: Secondary | ICD-10-CM

## 2023-12-31 NOTE — Progress Notes (Signed)
  Because your symptoms are not like a typical yeast infection or bacterial vaginosis, I feel your condition warrants further evaluation and I recommend that you be seen in a face-to-face visit. Recommend further testing that we cannot do over telehealth.    NOTE: There will be NO CHARGE for this E-Visit   If you are having a true medical emergency, please call 911.     For an urgent face to face visit, [place] has multiple urgent care centers for your convenience.  Click the link below for the full list of locations and hours, walk-in wait times, appointment scheduling options and driving directions:  Urgent Care - [place], [place], [place], [place], [place], [place]  [place]     Your MyChart E-visit questionnaire answers were reviewed by a board certified advanced clinical practitioner to complete your personal care plan based on your specific symptoms.    Thank you for using e-Visits.

## 2024-02-08 DIAGNOSIS — Z3202 Encounter for pregnancy test, result negative: Secondary | ICD-10-CM | POA: Diagnosis not present

## 2024-02-08 DIAGNOSIS — N3001 Acute cystitis with hematuria: Secondary | ICD-10-CM | POA: Diagnosis not present

## 2024-02-08 DIAGNOSIS — R300 Dysuria: Secondary | ICD-10-CM | POA: Diagnosis not present

## 2024-02-08 DIAGNOSIS — R102 Pelvic and perineal pain: Secondary | ICD-10-CM | POA: Diagnosis not present

## 2024-02-08 DIAGNOSIS — N939 Abnormal uterine and vaginal bleeding, unspecified: Secondary | ICD-10-CM | POA: Diagnosis not present

## 2024-02-08 DIAGNOSIS — Z113 Encounter for screening for infections with a predominantly sexual mode of transmission: Secondary | ICD-10-CM | POA: Diagnosis not present

## 2024-02-08 DIAGNOSIS — R109 Unspecified abdominal pain: Secondary | ICD-10-CM | POA: Diagnosis not present
